# Patient Record
Sex: Male | Born: 1990 | Race: White | Hispanic: No | Marital: Single
Health system: Southern US, Community
[De-identification: ages and names within clinical notes are randomized; demographics above are authoritative.]

## PROBLEM LIST (undated history)

## (undated) HISTORY — PX: BRAIN SURGERY: SHX531

---
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
## 2015-02-26 ENCOUNTER — Inpatient Hospital Stay (HOSPITAL_COMMUNITY): Payer: Worker's Compensation

## 2015-02-26 ENCOUNTER — Emergency Department (HOSPITAL_COMMUNITY): Payer: Worker's Compensation

## 2015-02-26 ENCOUNTER — Inpatient Hospital Stay (HOSPITAL_COMMUNITY)
Admission: EM | Admit: 2015-02-26 | Discharge: 2015-03-01 | DRG: 964 | Disposition: A | Discharge status: Home / Self Care | Payer: Worker's Compensation | Attending: General Surgery | Admitting: General Surgery

## 2015-02-26 ENCOUNTER — Encounter (HOSPITAL_COMMUNITY): Payer: Self-pay | Admitting: *Deleted

## 2015-02-26 DIAGNOSIS — Z91013 Allergy to seafood: Secondary | ICD-10-CM | POA: Diagnosis not present

## 2015-02-26 DIAGNOSIS — S2243XA Multiple fractures of ribs, bilateral, initial encounter for closed fracture: Secondary | ICD-10-CM | POA: Diagnosis present

## 2015-02-26 DIAGNOSIS — Z91030 Bee allergy status: Secondary | ICD-10-CM | POA: Diagnosis not present

## 2015-02-26 DIAGNOSIS — W3189XA Contact with other specified machinery, initial encounter: Secondary | ICD-10-CM

## 2015-02-26 DIAGNOSIS — S37002A Unspecified injury of left kidney, initial encounter: Secondary | ICD-10-CM | POA: Diagnosis present

## 2015-02-26 DIAGNOSIS — J939 Pneumothorax, unspecified: Secondary | ICD-10-CM

## 2015-02-26 DIAGNOSIS — Z91012 Allergy to eggs: Secondary | ICD-10-CM | POA: Diagnosis not present

## 2015-02-26 DIAGNOSIS — S37012A Minor contusion of left kidney, initial encounter: Secondary | ICD-10-CM | POA: Diagnosis present

## 2015-02-26 DIAGNOSIS — S299XXA Unspecified injury of thorax, initial encounter: Secondary | ICD-10-CM

## 2015-02-26 DIAGNOSIS — T148XXA Other injury of unspecified body region, initial encounter: Secondary | ICD-10-CM

## 2015-02-26 DIAGNOSIS — Y990 Civilian activity done for income or pay: Secondary | ICD-10-CM | POA: Diagnosis not present

## 2015-02-26 DIAGNOSIS — S270XXA Traumatic pneumothorax, initial encounter: Secondary | ICD-10-CM

## 2015-02-26 DIAGNOSIS — R339 Retention of urine, unspecified: Secondary | ICD-10-CM | POA: Diagnosis not present

## 2015-02-26 DIAGNOSIS — J342 Deviated nasal septum: Secondary | ICD-10-CM | POA: Diagnosis present

## 2015-02-26 DIAGNOSIS — Y9269 Other specified industrial and construction area as the place of occurrence of the external cause: Secondary | ICD-10-CM

## 2015-02-26 DIAGNOSIS — S298XXA Other specified injuries of thorax, initial encounter: Secondary | ICD-10-CM | POA: Diagnosis present

## 2015-02-26 DIAGNOSIS — Z9689 Presence of other specified functional implants: Secondary | ICD-10-CM

## 2015-02-26 DIAGNOSIS — S272XXA Traumatic hemopneumothorax, initial encounter: Principal | ICD-10-CM | POA: Diagnosis present

## 2015-02-26 DIAGNOSIS — T148 Other injury of unspecified body region: Secondary | ICD-10-CM | POA: Diagnosis present

## 2015-02-26 DIAGNOSIS — R338 Other retention of urine: Secondary | ICD-10-CM | POA: Diagnosis not present

## 2015-02-26 DIAGNOSIS — S2249XA Multiple fractures of ribs, unspecified side, initial encounter for closed fracture: Secondary | ICD-10-CM

## 2015-02-26 LAB — COMPREHENSIVE METABOLIC PANEL
ALT: 93 U/L — AB (ref 17–63)
ANION GAP: 11 (ref 5–15)
AST: 86 U/L — ABNORMAL HIGH (ref 15–41)
Albumin: 4.7 g/dL (ref 3.5–5.0)
Alkaline Phosphatase: 39 U/L (ref 38–126)
BUN: 13 mg/dL (ref 6–20)
CHLORIDE: 101 mmol/L (ref 101–111)
CO2: 22 mmol/L (ref 22–32)
CREATININE: 1.3 mg/dL — AB (ref 0.61–1.24)
Calcium: 9.2 mg/dL (ref 8.9–10.3)
Glucose, Bld: 131 mg/dL — ABNORMAL HIGH (ref 65–99)
POTASSIUM: 3.6 mmol/L (ref 3.5–5.1)
Sodium: 134 mmol/L — ABNORMAL LOW (ref 135–145)
Total Bilirubin: 1.4 mg/dL — ABNORMAL HIGH (ref 0.3–1.2)
Total Protein: 7.4 g/dL (ref 6.5–8.1)

## 2015-02-26 LAB — SAMPLE TO BLOOD BANK

## 2015-02-26 LAB — PROTIME-INR
INR: 1.14 (ref 0.00–1.49)
Prothrombin Time: 14.8 seconds (ref 11.6–15.2)

## 2015-02-26 LAB — CBC
HCT: 43.7 % (ref 39.0–52.0)
Hemoglobin: 14.9 g/dL (ref 13.0–17.0)
MCH: 30.7 pg (ref 26.0–34.0)
MCHC: 34.1 g/dL (ref 30.0–36.0)
MCV: 90.1 fL (ref 78.0–100.0)
PLATELETS: 202 10*3/uL (ref 150–400)
RBC: 4.85 MIL/uL (ref 4.22–5.81)
RDW: 12.9 % (ref 11.5–15.5)
WBC: 18.4 10*3/uL — AB (ref 4.0–10.5)

## 2015-02-26 LAB — CDS SEROLOGY

## 2015-02-26 LAB — ETHANOL: Alcohol, Ethyl (B): <5 mg/dL (ref <5)

## 2015-02-26 LAB — CK: Total CK: 568 U/L — ABNORMAL HIGH (ref 49–397)

## 2015-02-26 MED ORDER — OXYCODONE HCL 5 MG PO TABS
10.0000 mg | ORAL_TABLET | ORAL | Status: DC | PRN
Start: 2015-02-26 — End: 2015-03-01

## 2015-02-26 MED ORDER — IOHEXOL 300 MG/ML  SOLN
100.0000 mL | Freq: Once | INTRAMUSCULAR | Status: AC | PRN
  Administered 2015-02-26: 100 mL via INTRAVENOUS

## 2015-02-26 MED ORDER — ONDANSETRON HCL 4 MG/2ML IJ SOLN
4.0000 mg | Freq: Once | INTRAMUSCULAR | Status: AC
  Administered 2015-03-01: 4 mg via INTRAVENOUS
  Filled 2015-02-26: qty 2

## 2015-02-26 MED ORDER — KCL IN DEXTROSE-NACL 20-5-0.45 MEQ/L-%-% IV SOLN
INTRAVENOUS | Status: DC
  Administered 2015-02-27 – 2015-02-28 (×3): via INTRAVENOUS
  Filled 2015-02-26 (×4): qty 1000

## 2015-02-26 MED ORDER — SODIUM CHLORIDE 0.9 % IV BOLUS (SEPSIS)
1000.0000 mL | Freq: Once | INTRAVENOUS | Status: AC
  Administered 2015-02-26: 1000 mL via INTRAVENOUS

## 2015-02-26 MED ORDER — FENTANYL CITRATE (PF) 100 MCG/2ML IJ SOLN
100.0000 ug | Freq: Once | INTRAMUSCULAR | Status: AC
  Administered 2015-02-26: 100 ug via INTRAVENOUS
  Filled 2015-02-26: qty 2

## 2015-02-26 MED ORDER — LIDOCAINE-EPINEPHRINE 2 %-1:200000 IJ SOLN
20.0000 mL | Freq: Once | INTRAMUSCULAR | Status: AC
Start: 2015-02-26 — End: 2015-02-26
  Administered 2015-02-26: 20 mL via INTRADERMAL

## 2015-02-26 MED ORDER — FENTANYL CITRATE (PF) 100 MCG/2ML IJ SOLN: 50.0000 ug | INTRAMUSCULAR | Status: DC | PRN

## 2015-02-26 MED ORDER — ONDANSETRON HCL 4 MG/2ML IJ SOLN
4.0000 mg | Freq: Once | INTRAMUSCULAR | Status: AC
  Administered 2015-02-26: 4 mg via INTRAVENOUS
  Filled 2015-02-26: qty 2

## 2015-02-26 MED ORDER — PANTOPRAZOLE SODIUM 40 MG IV SOLR
40.0000 mg | Freq: Every day | INTRAVENOUS | Status: DC
  Administered 2015-02-27: 40 mg via INTRAVENOUS
  Filled 2015-02-26 (×3): qty 40

## 2015-02-26 MED ORDER — HYDROMORPHONE HCL 1 MG/ML IJ SOLN: 1.0000 mg | Freq: Once | INTRAMUSCULAR | Status: DC

## 2015-02-26 MED ORDER — HYDROMORPHONE HCL 1 MG/ML IJ SOLN
1.0000 mg | Freq: Once | INTRAMUSCULAR | Status: AC
  Administered 2015-02-26: 1 mg via INTRAVENOUS
  Filled 2015-02-26: qty 1

## 2015-02-26 MED ORDER — PROMETHAZINE HCL 25 MG/ML IJ SOLN
25.0000 mg | Freq: Four times a day (QID) | INTRAMUSCULAR | Status: DC | PRN
  Administered 2015-02-26: 25 mg via INTRAVENOUS
  Filled 2015-02-26: qty 1

## 2015-02-26 MED ORDER — SODIUM CHLORIDE 0.9 % IV SOLN
25.0000 mg | Freq: Three times a day (TID) | INTRAVENOUS | Status: DC | PRN
  Filled 2015-02-26: qty 1

## 2015-02-26 MED ORDER — LIDOCAINE-EPINEPHRINE 1 %-1:100000 IJ SOLN
30.0000 mL | Freq: Once | INTRAMUSCULAR | Status: AC
  Administered 2015-02-26: 30 mL via INTRADERMAL
  Filled 2015-02-26: qty 1

## 2015-02-26 MED ORDER — FENTANYL CITRATE (PF) 100 MCG/2ML IJ SOLN
100.0000 ug | Freq: Once | INTRAMUSCULAR | Status: AC
  Administered 2015-02-26: 100 ug via INTRAVENOUS

## 2015-02-26 MED ORDER — ENOXAPARIN SODIUM 40 MG/0.4ML ~~LOC~~ SOLN
40.0000 mg | SUBCUTANEOUS | Status: DC
  Administered 2015-02-27 – 2015-03-01 (×3): 40 mg via SUBCUTANEOUS
  Filled 2015-02-26 (×3): qty 0.4

## 2015-02-26 MED ORDER — LIDOCAINE HCL (PF) 1 % IJ SOLN: 30.0000 mL | Freq: Once | INTRAMUSCULAR | Status: DC

## 2015-02-26 MED ORDER — PROMETHAZINE HCL 25 MG/ML IJ SOLN
25.0000 mg | Freq: Four times a day (QID) | INTRAMUSCULAR | Status: DC | PRN
  Administered 2015-02-27: 25 mg via INTRAVENOUS
  Filled 2015-02-26: qty 1

## 2015-02-26 MED ORDER — OXYCODONE HCL 5 MG PO TABS: 5.0000 mg | ORAL_TABLET | ORAL | Status: DC | PRN

## 2015-02-26 MED ORDER — PANTOPRAZOLE SODIUM 40 MG PO TBEC
40.0000 mg | DELAYED_RELEASE_TABLET | Freq: Every day | ORAL | Status: DC
  Administered 2015-02-28: 40 mg via ORAL
  Filled 2015-02-26: qty 1

## 2015-02-26 MED ORDER — PROMETHAZINE HCL 25 MG/ML IJ SOLN
25.0000 mg | Freq: Once | INTRAMUSCULAR | Status: AC
  Administered 2015-02-26: 25 mg via INTRAVENOUS

## 2015-02-26 MED ORDER — ACETAMINOPHEN 325 MG PO TABS
650.0000 mg | ORAL_TABLET | ORAL | Status: DC | PRN
  Administered 2015-02-28 (×2): 650 mg via ORAL
  Filled 2015-02-26: qty 2

## 2015-02-26 NOTE — ED Notes (Signed)
Oxygen 2l via  applied due to insertion of chest tube

## 2015-02-26 NOTE — ED Notes (Signed)
Patient vomiting, order recd for Zofran.

## 2015-02-26 NOTE — ED Notes (Signed)
Family at bedside. 

## 2015-02-26 NOTE — ED Notes (Signed)
Pt transported to CT with RN and EMT
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
## 2015-02-26 NOTE — ED Notes (Signed)
Patient vomiting -  aware
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
## 2015-02-26 NOTE — H&P (Signed)
Erik Kirk is an 24 y.o. male.   Chief Complaint: B back and rib pain HPI: Erik Kirk was working around a forklift when he was pinned up against a metal structure. No LOC. He is unsure how long he was pinned. C/O B rib pain and SOB. No LOC. He came in as a level 2 trauma. Work up demonstrated B rib FXs with R 20% PTX and L trace PTX. He also has a grade 1 L renal contusion. Trauma was called to admit.  History reviewed. No pertinent past medical history.  Past Surgical History  Procedure Laterality Date  . Brain surgery      No family history on file. Social History:  has no tobacco, alcohol, and drug history on file.  Allergies:  Allergies  Allergen Reactions  . Bee Venom Anaphylaxis  . Eggs Or Egg-Derived Products Anaphylaxis and Hives  . Shellfish Allergy Anaphylaxis and Hives     (Not in a hospital admission)  Results for orders placed or performed during the hospital encounter of 02/26/15 (from the past 48 hour(s))  Sample to Blood Bank     Status: None   Collection Time: 02/26/15  6:17 PM  Result Value Ref Range   Blood Bank Specimen SAMPLE AVAILABLE FOR TESTING    Sample Expiration 02/27/2015   Comprehensive metabolic panel     Status: Abnormal   Collection Time: 02/26/15  6:20 PM  Result Value Ref Range   Sodium 134 (L) 135 - 145 mmol/L   Potassium 3.6 3.5 - 5.1 mmol/L   Chloride 101 101 - 111 mmol/L   CO2 22 22 - 32 mmol/L   Glucose, Bld 131 (H) 65 - 99 mg/dL   BUN 13 6 - 20 mg/dL   Creatinine, Ser 1.30 (H) 0.61 - 1.24 mg/dL   Calcium 9.2 8.9 - 10.3 mg/dL   Total Protein 7.4 6.5 - 8.1 g/dL   Albumin 4.7 3.5 - 5.0 g/dL   AST 86 (H) 15 - 41 U/L   ALT 93 (H) 17 - 63 U/L   Alkaline Phosphatase 39 38 - 126 U/L   Total Bilirubin 1.4 (H) 0.3 - 1.2 mg/dL   GFR calc non Af Amer >60 >60 mL/min   GFR calc Af Amer >60 >60 mL/min    Comment: (NOTE) The eGFR has been calculated using the CKD EPI equation. This calculation has not been validated in all clinical  situations. eGFR's persistently <60 mL/min signify possible Chronic Kidney Disease.    Anion gap 11 5 - 15  CBC     Status: Abnormal   Collection Time: 02/26/15  6:20 PM  Result Value Ref Range   WBC 18.4 (H) 4.0 - 10.5 K/uL   RBC 4.85 4.22 - 5.81 MIL/uL   Hemoglobin 14.9 13.0 - 17.0 g/dL   HCT 43.7 39.0 - 52.0 %   MCV 90.1 78.0 - 100.0 fL   MCH 30.7 26.0 - 34.0 pg   MCHC 34.1 30.0 - 36.0 g/dL   RDW 12.9 11.5 - 15.5 %   Platelets 202 150 - 400 K/uL  Ethanol     Status: None   Collection Time: 02/26/15  6:20 PM  Result Value Ref Range   Alcohol, Ethyl (B) <5 <5 mg/dL    Comment:        LOWEST DETECTABLE LIMIT FOR SERUM ALCOHOL IS 5 mg/dL FOR MEDICAL PURPOSES ONLY   Protime-INR     Status: None   Collection Time: 02/26/15  6:20 PM  Result Value Ref  Range   Prothrombin Time 14.8 11.6 - 15.2 seconds   INR 1.14 0.00 - 1.49  CK     Status: Abnormal   Collection Time: 02/26/15  6:20 PM  Result Value Ref Range   Total CK 568 (H) 49 - 397 U/L   Ct Head Wo Contrast  02/26/2015  CLINICAL DATA:  24 year old involved in a forklift accident while work earlier today. The patient's body was pinned between a forklift and a metal rack. Patient unable to lift his legs. Initial encounter. EXAM: CT HEAD WITHOUT CONTRAST CT CERVICAL SPINE WITHOUT CONTRAST TECHNIQUE: Multidetector CT imaging of the head and cervical spine was performed following the standard protocol without intravenous contrast. Multiplanar CT image reconstructions of the cervical spine were also generated. COMPARISON:  None. FINDINGS: CT HEAD FINDINGS Ventricular system normal in size and appearance for age. No mass lesion. No midline shift. No acute hemorrhage or hematoma. No extra-axial fluid collections. No evidence of acute infarction. No focal brain parenchymal abnormalities. No skull fractures or other focal osseous abnormalities involving the skull. Small mucous retention cysts or polyps in the maxillary sinuses. Remaining  paranasal sinuses, bilateral mastoid air cells, and bilateral middle ear cavities well-aerated. Note made of severe bony nasal septal deviation to the right. CT CERVICAL SPINE FINDINGS No fractures identified involving the cervical spine. Sagittal reconstructed images demonstrate anatomic posterior alignment. No evidence of spinal stenosis. Neural foramina widely patent throughout. Facet joints intact throughout. Disc spaces well preserved, and no significant disc protrusion identified on the soft tissue windows. Coronal reformatted images demonstrate an intact craniocervical junction, intact dens and intact lateral masses throughout. Note is made of a right apical pneumothorax. The patient had a CT chest, abdomen and pelvis which will be reported separately. IMPRESSION: 1. Normal intracranially. 2. No cervical spine fractures identified. 3. Right apical pneumothorax. Please see results of the CT chest, abdomen and pelvis reported separately. Electronically Signed   By: Evangeline Dakin M.D.   On: 02/26/2015 19:26   Ct Chest W Contrast  02/26/2015  CLINICAL DATA:  Diffuse back pain after being crushed between a forklift and metal back. The patient is unable to lift his legs. Right leg pain. EXAM: CT CHEST, ABDOMEN, AND PELVIS WITH CONTRAST TECHNIQUE: Multidetector CT imaging of the chest, abdomen and pelvis was performed following the standard protocol during bolus administration of intravenous contrast. CONTRAST:  173m OMNIPAQUE IOHEXOL 300 MG/ML  SOLN COMPARISON:  None. FINDINGS: CT CHEST FINDINGS Mediastinum/Lymph Nodes: No mediastinal hemorrhage or enlarged lymph nodes. Lungs/Pleura: Approximately 20% right pneumothorax and minimal left pneumothorax. There is also mild patchy airspace opacity in the inferior aspect of the right lower lobe and a small amount of right pleural blood. No lung nodules. Musculoskeletal: Displaced, mildly comminuted left posterior eleventh rib fracture. This is at the level of the  upper pole of the left kidney. Comminuted, displaced right posterior tenth rib fracture. This includes a fragment displaced anteriorly and protruding into the posterior aspect of the right lower lobe. There is also a mildly displaced right posterior eleventh rib fracture. Mildly displaced right posterior ninth rib fracture. No spinal fractures or subluxations are seen. CT ABDOMEN PELVIS FINDINGS Hepatobiliary: Small cyst in the central right lobe of the liver. The remainder of the liver appears normal. Pancreas: Normal. Spleen: Normal. Adrenals/Urinary Tract: Normal appearing adrenal glands. Mild, ill-defined low density in the upper pole of the right kidney with some skull loss of the corticomedullary differentiation. The renal arteries and veins appear intact bilaterally. Small  right renal cyst. No ureteral or bladder abnormalities are seen. Normal-sized prostate gland. Stomach/Bowel: No abnormalities.  Normal appearing appendix. Vascular/Lymphatic: Unremarkable. Intact renal arteries and veins bilaterally. Reproductive: Unremarkable. Other: No free peritoneal fluid or air. 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 No other fractures and no subluxations. IMPRESSION: 1. Approximately 20% right pneumothorax and minimal left pneumothorax. 2. Contusion in the inferior, posterior aspect of the right lower lobe. 3. Small amount of right pleural blood. 4. Right posterior ninth, tenth and eleventh rib fractures and left posterior eleventh rib fracture. The right posterior tenth rib fracture has an anteriorly displaced linear fragment with its tip within the posterior aspect of the right lower lobe. 5. Contusion of the upper pole of the left kidney. 6. 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 Critical Value/emergent results were called by telephone at the time of interpretation on 02/26/2015 at 7:31 pm  to Dr. Evelina Bucy , who verbally acknowledged these results. Electronically Signed   By: Claudie Revering M.D.   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 The patient's body was pinned between a forklift and a metal rack. Patient unable to lift his legs. Initial encounter. EXAM: CT HEAD WITHOUT CONTRAST CT CERVICAL SPINE WITHOUT CONTRAST TECHNIQUE: Multidetector CT imaging of the head and cervical spine was performed following the standard protocol without intravenous contrast. Multiplanar CT image reconstructions of the cervical spine were also generated. COMPARISON:  None. FINDINGS: CT HEAD FINDINGS Ventricular system normal in size and appearance for age. No mass lesion. No midline shift. No acute hemorrhage or hematoma. No extra-axial fluid collections. No evidence of acute infarction. No focal brain parenchymal abnormalities. No skull fractures or other focal osseous abnormalities involving the skull. Small mucous retention cysts or polyps in the maxillary sinuses. Remaining paranasal sinuses, bilateral mastoid air cells, and bilateral middle ear cavities well-aerated. Note made of severe bony nasal septal deviation to the right. CT CERVICAL SPINE FINDINGS No fractures identified involving the cervical spine. Sagittal reconstructed images demonstrate anatomic posterior alignment. No evidence of spinal stenosis. Neural foramina widely patent throughout. Facet joints intact throughout. Disc spaces well preserved, and no significant disc protrusion identified on the soft tissue windows. Coronal reformatted images demonstrate an intact craniocervical junction, intact dens and intact lateral masses throughout. Note is made of a right apical pneumothorax. The patient had a CT chest, abdomen and pelvis which will be reported separately. IMPRESSION: 1. Normal intracranially. 2. No cervical spine fractures  identified. 3. Right apical pneumothorax. Please see results of the CT chest, abdomen and pelvis reported separately. Electronically Signed   By: Evangeline Dakin M.D.   On: 02/26/2015 19:26   Ct Thoracic Spine Wo Contrast  02/26/2015  CLINICAL DATA:  Diffuse back pain following a crush injury between a forklift and metal rack. EXAM: CT THORACIC SPINE WITHOUT CONTRAST TECHNIQUE: Multidetector CT imaging of the thoracic spine was performed without intravenous contrast administration. Multiplanar CT image reconstructions were also generated. COMPARISON:  Chest CT obtained at the same time. FINDINGS: Previously described bilateral pneumothoraces and bilateral rib fractures. Previously described right lower lobe pulmonary contusion and small amount of right pleural blood. No thoracic spine fracture or subluxation is seen. Small Schmorl's nodes at multiple levels. IMPRESSION: 1. No thoracic spine fracture or subluxation. 2. Previously described bilateral rib fractures, right lower lobe  pulmonary contusion, right pleural blood and bilateral pneumothoraces. Electronically Signed   By: Claudie Revering M.D.   On: 02/26/2015 19:49   Ct Lumbar Spine Wo Contrast  02/26/2015  CLINICAL DATA:  Initial encounter for fork lift accident earlier today. Pain down entire spine. EXAM: CT LUMBAR SPINE WITHOUT CONTRAST TECHNIQUE: Multidetector CT imaging of the lumbar spine was performed without intravenous contrast administration. Multiplanar CT image reconstructions were also generated. COMPARISON:  None. FINDINGS: No evidence for lumbar spine fracture. Intervertebral disc spaces are preserved. The facets are well aligned bilaterally. Bilateral L5 pars interarticularis defects are evident with trace anterolisthesis of L5 on S1. No evidence for paraspinal hematoma. Fracture the posterior right eleventh and twelfth ribs noted. Although incompletely characterize, there appears to be decreased perfusion to the upper pole of the left  kidney. IMPRESSION: 1. No evidence for lumbar spine fracture. 2. 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 3. Acute fractures of the posterior right eleventh and twelfth ribs. Of note, the entire ribs have not been fully visualized on either side on this dedicated lumbar spine CT. 4. Apparent decreased or altered perfusion to the upper pole the left kidney, incompletely characterized. Electronically Signed   By: Misty Stanley M.D.   On: 02/26/2015 19:58   Ct Abdomen Pelvis W Contrast  02/26/2015  CLINICAL DATA:  Diffuse back pain after being crushed between a forklift and metal back. The patient is unable to lift his legs. Right leg pain. EXAM: CT CHEST, ABDOMEN, AND PELVIS WITH CONTRAST TECHNIQUE: Multidetector CT imaging of the chest, abdomen and pelvis was performed following the standard protocol during bolus administration of intravenous contrast. CONTRAST:  158m OMNIPAQUE IOHEXOL 300 MG/ML  SOLN COMPARISON:  None. FINDINGS: CT CHEST FINDINGS Mediastinum/Lymph Nodes: No mediastinal hemorrhage or enlarged lymph nodes. Lungs/Pleura: Approximately 20% right pneumothorax and minimal left pneumothorax. There is also mild patchy airspace opacity in the inferior aspect of the right lower lobe and a small amount of right pleural blood. No lung nodules. Musculoskeletal: Displaced, mildly comminuted left posterior eleventh rib fracture. This is at the level of the upper pole of the left kidney. Comminuted, displaced right posterior tenth rib fracture. This includes a fragment displaced anteriorly and protruding into the posterior aspect of the right lower lobe. There is also a mildly displaced right posterior eleventh rib fracture. Mildly displaced right posterior ninth rib fracture. No spinal fractures or subluxations are seen. CT ABDOMEN PELVIS FINDINGS Hepatobiliary: Small cyst in the central right lobe of the liver. The remainder of the liver appears normal. Pancreas: Normal. Spleen:  Normal. Adrenals/Urinary Tract: Normal appearing adrenal glands. Mild, ill-defined low density in the upper pole of the right kidney with some skull loss of the corticomedullary differentiation. The renal arteries and veins appear intact bilaterally. Small right renal cyst. No ureteral or bladder abnormalities are seen. Normal-sized prostate gland. Stomach/Bowel: No abnormalities.  Normal appearing appendix. Vascular/Lymphatic: Unremarkable. Intact renal arteries and veins bilaterally. Reproductive: Unremarkable. Other: No free peritoneal fluid or air. 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 No other fractures and no subluxations. IMPRESSION: 1. Approximately 20% right pneumothorax and minimal left pneumothorax. 2. Contusion in the inferior, posterior aspect of the right lower lobe. 3. Small amount of right pleural blood. 4. Right posterior ninth, tenth and eleventh rib fractures and left posterior eleventh rib fracture. The right posterior tenth rib fracture has an anteriorly displaced linear fragment with its tip within the posterior aspect  of the right lower lobe. 5. Contusion of the upper pole of the left kidney. 6. 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 Critical Value/emergent results were called by telephone at the time of interpretation on 02/26/2015 at 7:31 pm to Dr. Evelina Bucy , who verbally acknowledged these results. Electronically Signed   By: Claudie Revering M.D.   On: 02/26/2015 19:45   Dg Pelvis Portable  02/26/2015  CLINICAL DATA:  Right thigh pain after being pinned between a forklift and metal rack today. EXAM: PORTABLE PELVIS 1-2 VIEWS COMPARISON:  None. FINDINGS: The right iliac crest is not included in its entirety. The remainder of the pelvis has a normal appearance without fracture or dislocation. IMPRESSION: No fracture or dislocation. Electronically Signed   By:  Claudie Revering M.D.   On: 02/26/2015 18:36   Dg Chest Portable 1 View  02/26/2015  CLINICAL DATA:  Trauma from fork lift.  Initial encounter. EXAM: PORTABLE CHEST 1 VIEW COMPARISON:  None. FINDINGS: The heart size and mediastinal contours are within normal limits. There is no evidence of pulmonary edema, consolidation, pneumothorax, nodule or pleural fluid. The visualized skeletal structures are unremarkable. IMPRESSION: No acute findings. Electronically Signed   By: Aletta Edouard M.D.   On: 02/26/2015 18:34   Dg Femur Port, 1v Right  02/26/2015  CLINICAL DATA:  Right thigh pain after being pinned between a forklift and metal rack today. EXAM: RIGHT FEMUR PORTABLE 1 VIEW COMPARISON:  None. FINDINGS: 2 portable AP views of the right femur demonstrate normal appearing bones and soft tissues. No fracture or dislocation seen on these views. IMPRESSION: Limited examination in the AP projection only with no visible fracture or dislocation. If there remains a clinical concern for fracture, a routine two-view examination would be recommended. Electronically Signed   By: Claudie Revering M.D.   On: 02/26/2015 18:35    Review of Systems  Constitutional: Negative.   HENT: Negative.   Eyes: Negative.   Respiratory: Positive for shortness of breath.        Pain with deep breaths  Cardiovascular: Positive for chest pain.  Gastrointestinal: Negative.   Genitourinary: Negative.   Musculoskeletal: Positive for back pain.  Skin: Negative.   Neurological: Negative.   Endo/Heme/Allergies: Negative.   Psychiatric/Behavioral: Negative.     Blood pressure 126/83, pulse 82, temperature 98.2 F (36.8 C), temperature source Oral, resp. rate 13, height _0  (1.803 m), weight 79.833 kg (176 lb), SpO2 94 %. Physical Exam  Constitutional: He appears well-developed and well-nourished. He appears distressed.  HENT:  Head: Normocephalic. Head is without abrasion and without contusion.  Right Ear: Hearing, tympanic  membrane, external ear and ear canal normal.  Left Ear: Hearing, tympanic membrane, external ear and ear canal normal.  Nose: No sinus tenderness or nasal deformity.  Mouth/Throat: Uvula is midline, oropharynx is clear and moist and mucous membranes are normal.  Eyes: EOM are normal. Pupils are equal, round, and reactive to light. Right eye exhibits no discharge. Left eye exhibits no discharge.  Neck: No tracheal deviation present.  No posterior midline tenderness, no pain on AROM  Cardiovascular: Regular rhythm, normal heart sounds and intact distal pulses.   Tachy 120  Respiratory: Breath sounds normal. No stridor. He has no wheezes. He has no rales. He exhibits tenderness.  B posterior rib tenderness  GI: Soft. He exhibits no distension. There is no tenderness. There is no rebound and no guarding.  Genitourinary: Penis normal.  Musculoskeletal:  Normal range of motion. He exhibits no edema or tenderness.  Neurological: He is alert. He displays no atrophy and no tremor. He exhibits normal muscle tone. He displays no seizure activity. GCS eye subscore is 4. GCS verbal subscore is 5. GCS motor subscore is 6.  Skin: Skin is warm.  Psychiatric:  anxious     Assessment/Plan Pinned by forklift R rib FXs 9-11 with 20% PTX L 11th rib FX with trace PTX L grade 1 upper pole renal contusion  Place R chest tube in ED. Admit to trauma, SDU. F/U labs, pulmonary toilet, & CXR. I spoke with his family.  Sael Furches E 02/26/2015, 9:37 PM
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
## 2015-02-27 ENCOUNTER — Inpatient Hospital Stay (HOSPITAL_COMMUNITY): Payer: Worker's Compensation

## 2015-02-27 LAB — BASIC METABOLIC PANEL
Anion gap: 6 (ref 5–15)
BUN: 12 mg/dL (ref 6–20)
CALCIUM: 8.4 mg/dL — AB (ref 8.9–10.3)
CHLORIDE: 102 mmol/L (ref 101–111)
CO2: 26 mmol/L (ref 22–32)
CREATININE: 1.29 mg/dL — AB (ref 0.61–1.24)
GFR calc non Af Amer: >60 mL/min (ref >60)
GLUCOSE: 152 mg/dL — AB (ref 65–99)
Potassium: 4.2 mmol/L (ref 3.5–5.1)
Sodium: 134 mmol/L — ABNORMAL LOW (ref 135–145)

## 2015-02-27 LAB — CBC
HEMATOCRIT: 37.4 % — AB (ref 39.0–52.0)
HEMOGLOBIN: 12.5 g/dL — AB (ref 13.0–17.0)
MCH: 30.6 pg (ref 26.0–34.0)
MCHC: 33.4 g/dL (ref 30.0–36.0)
MCV: 91.4 fL (ref 78.0–100.0)
Platelets: 171 10*3/uL (ref 150–400)
RBC: 4.09 MIL/uL — AB (ref 4.22–5.81)
RDW: 13.1 % (ref 11.5–15.5)
WBC: 10.7 10*3/uL — AB (ref 4.0–10.5)

## 2015-02-27 LAB — MRSA PCR SCREENING: MRSA by PCR: NEGATIVE

## 2015-02-27 MED ORDER — KETOROLAC TROMETHAMINE 15 MG/ML IJ SOLN
15.0000 mg | Freq: Four times a day (QID) | INTRAMUSCULAR | Status: DC
  Administered 2015-02-27 – 2015-03-01 (×7): 15 mg via INTRAVENOUS
  Filled 2015-02-27 (×10): qty 1

## 2015-02-27 MED ORDER — PROCHLORPERAZINE EDISYLATE 5 MG/ML IJ SOLN
10.0000 mg | Freq: Four times a day (QID) | INTRAMUSCULAR | Status: DC | PRN
  Filled 2015-02-27 (×2): qty 2

## 2015-02-27 MED ORDER — KETOROLAC TROMETHAMINE 30 MG/ML IJ SOLN
30.0000 mg | Freq: Once | INTRAMUSCULAR | Status: AC
  Administered 2015-02-27: 30 mg via INTRAVENOUS
  Filled 2015-02-27: qty 1

## 2015-02-27 MED ORDER — BETHANECHOL CHLORIDE 10 MG PO TABS
10.0000 mg | ORAL_TABLET | Freq: Three times a day (TID) | ORAL | Status: DC
  Administered 2015-02-27 – 2015-02-28 (×6): 10 mg via ORAL
  Filled 2015-02-27 (×9): qty 1

## 2015-02-27 MED ORDER — PROCHLORPERAZINE EDISYLATE 5 MG/ML IJ SOLN
10.0000 mg | Freq: Once | INTRAMUSCULAR | Status: AC
  Administered 2015-02-27: 10 mg via INTRAVENOUS
  Filled 2015-02-27: qty 2

## 2015-02-27 MED ORDER — TAMSULOSIN HCL 0.4 MG PO CAPS
0.4000 mg | ORAL_CAPSULE | Freq: Every day | ORAL | Status: DC
  Administered 2015-02-27 – 2015-02-28 (×2): 0.4 mg via ORAL
  Filled 2015-02-27 (×2): qty 1

## 2015-02-27 MED ORDER — MORPHINE SULFATE (PF) 2 MG/ML IV SOLN
2.0000 mg | INTRAVENOUS | Status: DC | PRN
  Administered 2015-03-01: 4 mg via INTRAVENOUS
  Filled 2015-02-27: qty 2

## 2015-02-27 MED ORDER — LORAZEPAM 2 MG/ML IJ SOLN
0.5000 mg | INTRAMUSCULAR | Status: DC | PRN
  Administered 2015-02-27 (×2): 1 mg via INTRAVENOUS
  Filled 2015-02-27 (×2): qty 1

## 2015-02-27 MED ORDER — METOCLOPRAMIDE HCL 5 MG/ML IJ SOLN
5.0000 mg | Freq: Three times a day (TID) | INTRAMUSCULAR | Status: DC | PRN
  Filled 2015-02-27: qty 1
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
## 2015-02-27 NOTE — Progress Notes (Signed)
Trauma Service Note  Subjective: Patient sitting up in chair, having gotten there with the assistance of physical therapy  Objective: Vital signs in last 24 hours: Temp:  [97.7 F (36.5 C)-98.6 F (37 C)] 98.6 F (37 C) (11/01 0835) Pulse Rate:  [65-112] 68 (11/01 0800) Resp:  [0-33] 17 (11/01 0800) BP: (109-144)/(57-90) 142/78 mmHg (11/01 0800) SpO2:  [92 %-100 %] 100 % (11/01 0800) Weight:  [79.8 kg (175 lb 14.8 oz)-79.833 kg (176 lb)] 79.8 kg (175 lb 14.8 oz) (10/31 2340)    Intake/Output from previous day: 10/31 0701 - 11/01 0700 In: 2598.8 [P.O.:120; I.V.:2478.8] Out: 1235 [Urine:1210; Chest Tube:25] Intake/Output this shift: Total I/O In: 60 [P.O.:60] Out: 0   General: No acute distress, although he is breathing very shallow and has not used his IS.  Lungs: Clear, but shallow.  CXR shows not pneumothorax and there is no air leakage.  Abd: Soft, benign  Extremities: No changes  Neuro: Intact  Lab Results: CBC   Recent Labs  02/26/15 1820 02/27/15 0333  WBC 18.4* 10.7*  HGB 14.9 12.5*  HCT 43.7 37.4*  PLT 202 171   BMET  Recent Labs  02/26/15 1820 02/27/15 0333  NA 134* 134*  K 3.6 4.2  CL 101 102  CO2 22 26  GLUCOSE 131* 152*  BUN 13 12  CREATININE 1.30* 1.29*  CALCIUM 9.2 8.4*   PT/INR  Recent Labs  02/26/15 1820  LABPROT 14.8  INR 1.14   ABG No results for input(s): PHART, HCO3 in the last 72 hours.  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 The patient's body was pinned between a forklift and a metal rack. Patient unable to lift his legs. Initial encounter. EXAM: CT HEAD WITHOUT CONTRAST CT CERVICAL SPINE WITHOUT CONTRAST TECHNIQUE: Multidetector CT imaging of the head and cervical spine was performed following the standard protocol without intravenous contrast. Multiplanar CT image reconstructions of the  cervical spine were also generated. COMPARISON:  None. FINDINGS: CT HEAD FINDINGS Ventricular system normal in size and appearance for age. No mass lesion. No midline shift. No acute hemorrhage or hematoma. No extra-axial fluid collections. No evidence of acute infarction. No focal brain parenchymal abnormalities. No skull fractures or other focal osseous abnormalities involving the skull. Small mucous retention cysts or polyps in the maxillary sinuses. Remaining paranasal sinuses, bilateral mastoid air cells, and bilateral middle ear cavities well-aerated. Note made of severe bony nasal septal deviation to the right. CT CERVICAL SPINE FINDINGS No fractures identified involving the cervical spine. Sagittal reconstructed images demonstrate anatomic posterior alignment. No evidence of spinal stenosis. Neural foramina widely patent throughout. Facet joints intact throughout. Disc spaces well preserved, and no significant disc protrusion identified on the soft tissue windows. Coronal reformatted images demonstrate an intact craniocervical junction, intact dens and intact lateral masses throughout. Note is made of a right apical pneumothorax. The patient had a CT chest, abdomen and pelvis which will be reported separately. IMPRESSION: 1. Normal intracranially. 2. No cervical spine fractures identified. 3. Right apical pneumothorax. Please see results of the CT chest, abdomen and pelvis reported separately. Electronically Signed   By: Hulan Saas M.D.   On: 02/26/2015 19:26   Ct Chest W Contrast  02/26/2015  CLINICAL DATA:  Diffuse back pain after being crushed between a forklift and metal back. The patient is unable to lift his legs. Right leg pain. EXAM:  CT CHEST, ABDOMEN, AND PELVIS WITH CONTRAST TECHNIQUE: Multidetector CT imaging of the chest, abdomen and pelvis was performed following the standard protocol during bolus administration of intravenous contrast. CONTRAST:  OMNIPAQUE IOHEXOL 300 MG/ML   SOLN COMPARISON:  None. FINDINGS: CT CHEST FINDINGS Mediastinum/Lymph Nodes: No mediastinal hemorrhage or enlarged lymph nodes. Lungs/Pleura: Approximately 20% right pneumothorax and minimal left pneumothorax. There is also mild patchy airspace opacity in the inferior aspect of the right lower lobe and a small amount of right pleural blood. No lung nodules. Musculoskeletal: Displaced, mildly comminuted left posterior eleventh rib fracture. This is at the level of the upper pole of the left kidney. Comminuted, displaced right posterior tenth rib fracture. This includes a fragment displaced anteriorly and protruding into the posterior aspect of the right lower lobe. There is also a mildly displaced right posterior eleventh rib fracture. Mildly displaced right posterior ninth rib fracture. No spinal fractures or subluxations are seen. CT ABDOMEN PELVIS FINDINGS Hepatobiliary: Small cyst in the central right lobe of the liver. The remainder of the liver appears normal. Pancreas: Normal. Spleen: Normal. Adrenals/Urinary Tract: Normal appearing adrenal glands. Mild, ill-defined low density in the upper pole of the right kidney with some skull loss of the corticomedullary differentiation. The renal arteries and veins appear intact bilaterally. Small right renal cyst. No ureteral or bladder abnormalities are seen. Normal-sized prostate gland. Stomach/Bowel: No abnormalities.  Normal appearing appendix. Vascular/Lymphatic: Unremarkable. Intact renal arteries and veins bilaterally. Reproductive: Unremarkable. Other: No free peritoneal fluid or air. 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 No other fractures and no subluxations. IMPRESSION: 1. Approximately 20% right pneumothorax and minimal left pneumothorax. 2. Contusion in the inferior, posterior aspect of the right lower lobe. 3. Small amount of right pleural blood. 4. Right posterior ninth, tenth and  eleventh rib fractures and left posterior eleventh rib fracture. The right posterior tenth rib fracture has an anteriorly displaced linear fragment with its tip within the posterior aspect of the right lower lobe. 5. Contusion of the upper pole of the left kidney. 6. 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 Critical Value/emergent results were called by telephone at the time of interpretation on 02/26/2015 at 7:31 pm to Dr. Elwin Mocha , who verbally acknowledged these results. Electronically Signed   By: Beckie Salts M.D.   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 The patient's body was pinned between a forklift and a metal rack. Patient unable to lift his legs. Initial encounter. EXAM: CT HEAD WITHOUT CONTRAST CT CERVICAL SPINE WITHOUT CONTRAST TECHNIQUE: Multidetector CT imaging of the head and cervical spine was performed following the standard protocol without intravenous contrast. Multiplanar CT image reconstructions of the cervical spine were also generated. COMPARISON:  None. FINDINGS: CT HEAD FINDINGS Ventricular system normal in size and appearance for age. No mass lesion. No midline shift. No acute hemorrhage or hematoma. No extra-axial fluid collections. No evidence of acute infarction. No focal brain parenchymal abnormalities. No skull fractures or other focal osseous abnormalities involving the skull. Small mucous retention cysts or polyps in the maxillary sinuses. Remaining paranasal sinuses, bilateral mastoid air cells, and bilateral middle ear cavities well-aerated. Note made of severe bony nasal septal deviation to the right. CT CERVICAL SPINE FINDINGS No fractures identified involving the cervical spine. Sagittal reconstructed images demonstrate anatomic posterior alignment.  No evidence of spinal stenosis. Neural foramina widely  patent throughout. Facet joints intact throughout. Disc spaces well preserved, and no significant disc protrusion identified on the soft tissue windows. Coronal reformatted images demonstrate an intact craniocervical junction, intact dens and intact lateral masses throughout. Note is made of a right apical pneumothorax. The patient had a CT chest, abdomen and pelvis which will be reported separately. IMPRESSION: 1. Normal intracranially. 2. No cervical spine fractures identified. 3. Right apical pneumothorax. Please see results of the CT chest, abdomen and pelvis reported separately. Electronically Signed   By: Hulan Saashomas  Lawrence M.D.   On: 02/26/2015 19:26   Ct Thoracic Spine Wo Contrast  02/26/2015  CLINICAL DATA:  Diffuse back pain following a crush injury between a forklift and metal rack. EXAM: CT THORACIC SPINE WITHOUT CONTRAST TECHNIQUE: Multidetector CT imaging of the thoracic spine was performed without intravenous contrast administration. Multiplanar CT image reconstructions were also generated. COMPARISON:  Chest CT obtained at the same time. FINDINGS: Previously described bilateral pneumothoraces and bilateral rib fractures. Previously described right lower lobe pulmonary contusion and small amount of right pleural blood. No thoracic spine fracture or subluxation is seen. Small Schmorl's nodes at multiple levels. IMPRESSION: 1. No thoracic spine fracture or subluxation. 2. Previously described bilateral rib fractures, right lower lobe pulmonary contusion, right pleural blood and bilateral pneumothoraces. Electronically Signed   By: Beckie SaltsSteven  Reid M.D.   On: 02/26/2015 19:49   Ct Lumbar Spine Wo Contrast  02/26/2015  CLINICAL DATA:  Initial encounter for fork lift accident earlier today. Pain down entire spine. EXAM: CT LUMBAR SPINE WITHOUT CONTRAST TECHNIQUE: Multidetector CT imaging of the lumbar spine was performed without intravenous contrast administration. Multiplanar CT image reconstructions  were also generated. COMPARISON:  None. FINDINGS: No evidence for lumbar spine fracture. Intervertebral disc spaces are preserved. The facets are well aligned bilaterally. Bilateral L5 pars interarticularis defects are evident with trace anterolisthesis of L5 on S1. No evidence for paraspinal hematoma. Fracture the posterior right eleventh and twelfth ribs noted. Although incompletely characterize, there appears to be decreased perfusion to the upper pole of the left kidney. IMPRESSION: 1. No evidence for lumbar spine fracture. 2. 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 3. Acute fractures of the posterior right eleventh and twelfth ribs. Of note, the entire ribs have not been fully visualized on either side on this dedicated lumbar spine CT. 4. Apparent decreased or altered perfusion to the upper pole the left kidney, incompletely characterized. Electronically Signed   By: Kennith CenterEric  Mansell M.D.   On: 02/26/2015 19:58   Ct Abdomen Pelvis W Contrast  02/26/2015  CLINICAL DATA:  Diffuse back pain after being crushed between a forklift and metal back. The patient is unable to lift his legs. Right leg pain. EXAM: CT CHEST, ABDOMEN, AND PELVIS WITH CONTRAST TECHNIQUE: Multidetector CT imaging of the chest, abdomen and pelvis was performed following the standard protocol during bolus administration of intravenous contrast. CONTRAST:  100mL OMNIPAQUE IOHEXOL 300 MG/ML  SOLN COMPARISON:  None. FINDINGS: CT CHEST FINDINGS Mediastinum/Lymph Nodes: No mediastinal hemorrhage or enlarged lymph nodes. Lungs/Pleura: Approximately 20% right pneumothorax and minimal left pneumothorax. There is also mild patchy airspace opacity in the inferior aspect of the right lower lobe and a small amount of right pleural blood. No lung nodules. Musculoskeletal: Displaced, mildly comminuted left posterior eleventh rib fracture. This is at the level of the upper pole of the left kidney. Comminuted, displaced right  posterior tenth rib  fracture. This includes a fragment displaced anteriorly and protruding into the posterior aspect of the right lower lobe. There is also a mildly displaced right posterior eleventh rib fracture. Mildly displaced right posterior ninth rib fracture. No spinal fractures or subluxations are seen. CT ABDOMEN PELVIS FINDINGS Hepatobiliary: Small cyst in the central right lobe of the liver. The remainder of the liver appears normal. Pancreas: Normal. Spleen: Normal. Adrenals/Urinary Tract: Normal appearing adrenal glands. Mild, ill-defined low density in the upper pole of the right kidney with some skull loss of the corticomedullary differentiation. The renal arteries and veins appear intact bilaterally. Small right renal cyst. No ureteral or bladder abnormalities are seen. Normal-sized prostate gland. Stomach/Bowel: No abnormalities.  Normal appearing appendix. Vascular/Lymphatic: Unremarkable. Intact renal arteries and veins bilaterally. Reproductive: Unremarkable. Other: No free peritoneal fluid or air. 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 No other fractures and no subluxations. IMPRESSION: 1. Approximately 20% right pneumothorax and minimal left pneumothorax. 2. Contusion in the inferior, posterior aspect of the right lower lobe. 3. Small amount of right pleural blood. 4. Right posterior ninth, tenth and eleventh rib fractures and left posterior eleventh rib fracture. The right posterior tenth rib fracture has an anteriorly displaced linear fragment with its tip within the posterior aspect of the right lower lobe. 5. Contusion of the upper pole of the left kidney. 6. 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 Critical Value/emergent results were called by telephone at the time of interpretation on 02/26/2015 at 7:31 pm to Dr. Elwin Mocha , who verbally acknowledged these  results. Electronically Signed   By: Beckie Salts M.D.   On: 02/26/2015 19:45   Dg Pelvis Portable  02/26/2015  CLINICAL DATA:  Right thigh pain after being pinned between a forklift and metal rack today. EXAM: PORTABLE PELVIS 1-2 VIEWS COMPARISON:  None. FINDINGS: The right iliac crest is not included in its entirety. The remainder of the pelvis has a normal appearance without fracture or dislocation. IMPRESSION: No fracture or dislocation. Electronically Signed   By: Beckie Salts M.D.   On: 02/26/2015 18:36   Dg Chest Port 1 View  02/27/2015  CLINICAL DATA:  Follow-up rib fractures EXAM: PORTABLE CHEST 1 VIEW COMPARISON:  Portable chest x-ray of February 26, 2015 FINDINGS: The lungs are well-expanded. There is no pneumothorax or pleural effusion. The right-sided chest tube is stable. The heart and pulmonary vascularity are normal. The mediastinum is normal in width. The observed bony thorax exhibits no acute abnormality. The known right rib fractures are not well demonstrated. IMPRESSION: There is no active cardiopulmonary disease. No recurrent pneumothorax is observed. Electronically Signed   By: David  [place] M.D.   On: 02/27/2015 07:35   Dg Chest Port 1 View  02/26/2015  CLINICAL DATA:  Status post chest tube placement EXAM: PORTABLE CHEST - 1 VIEW COMPARISON:  02/26/2015 FINDINGS: Right-sided chest tube is been placed in lies in the right apex. The previously seen right-sided pneumothorax has resolved in the interval. Left lung remains clear. The cardiac shadow is stable. The known right-sided rib fractures are not well appreciated on this study. IMPRESSION: Resolution of right-sided pneumothorax following chest tube placement. Electronically Signed   By: Alcide Clever M.D.   On: 02/26/2015 22:52   Dg Chest Portable 1 View  02/26/2015  CLINICAL DATA:  Trauma from fork lift.  Initial encounter. EXAM: PORTABLE CHEST 1 VIEW COMPARISON:  None. FINDINGS: The heart size and  mediastinal contours are  within normal limits. There is no evidence of pulmonary edema, consolidation, pneumothorax, nodule or pleural fluid. The visualized skeletal structures are unremarkable. IMPRESSION: No acute findings. Electronically Signed   By: Irish Lack M.D.   On: 02/26/2015 18:34   Dg Femur Port, 1v Right  02/26/2015  CLINICAL DATA:  Right thigh pain after being pinned between a forklift and metal rack today. EXAM: RIGHT FEMUR PORTABLE 1 VIEW COMPARISON:  None. FINDINGS: 2 portable AP views of the right femur demonstrate normal appearing bones and soft tissues. No fracture or dislocation seen on these views. IMPRESSION: Limited examination in the AP projection only with no visible fracture or dislocation. If there remains a clinical concern for fracture, a routine two-view examination would be recommended. Electronically Signed   By: Beckie Salts M.D.   On: 02/26/2015 18:35    Anti-infectives: Anti-infectives    None      Assessment/Plan: s/p  Continue foley due to acute urinary retention Will add some toradol for pain control  Persistent nausea has been a problem with pain medications Acute urinary retention, will start Flomax and urecholine.  LOS: 1 day   Marta Lamas. Gae Bon, MD, FACS [phone] Trauma Surgeon 02/27/2015

## 2015-02-27 NOTE — Progress Notes (Signed)
Inpatient Rehabilitation  Have received prescreen request for possible IP Rehab admission.  I note that pt. Currently is at a min guard assist level with PT tasks though limited in distance and tolerance.  I anticipate pt. will progress adequately with acute therapies and will not require IP Rehab.  At this time, we are not recommending IP Rehab consult.  Please call if questions.   Weldon PickingSusan Gavan Nordby PT Inpatient Rehab Admissions Coordinator Cell [phone] Office [phone]

## 2015-02-27 NOTE — Progress Notes (Signed)
Throughout night pt had repeated difficulty with nausea and subsequent emesis only partially relieved by IV anti-emetics (Zofran ineffective, Compazine and Phenergan provided partial relief).  Pt also endorsed severe claustrophobia, unrelieved pain, and anxiety surrounding his hospitalization. Twice (@approx . 0200 and 0600) pt appeared to be having panic attacks and after the second episode, Ativan 1mg  IV, via telephone order from Dr. Janee Mornhompson, was administered and effective at relaxing pt.  Pt refused analgesia throughout the night citing nausea and subsequent emesis as more painful than his persistent R flank pain.  Pt also developed acute urinary retention as evidence by a bladder scan and per telephone order from Dr. Janee Mornhompson, a foley was inserted via sterile technique and immediately delivered 900ml of clear, yellow urine.  Currently, pt appears to be resting comfortably and in no acute distress with mother at bedside. The above information was conveyed in report to oncoming RN, Robyn.

## 2015-02-27 NOTE — Progress Notes (Signed)
Received written telephone message from [employer], workers comp, for pt stating they need a 9-panel drug test. Spoke with Raynelle FanningJulie, Sports coachcase manager, who stated that I needed to call Casimiro NeedleMichael, [place] for trauma to request order for test.  Jamesetta Orleansalled Michael and he stated we could not collect it but company could send someone to the hospital to collect it from the patient and have their own test.  I called Olegario MessierKathy and reported this to her. She stated it was just a urine test and asked that we try to get it for them.  I will call Raynelle FanningJulie, case manager back and report this and also charge nurse Deanna. Marisue Ivanobyn Ebin Palazzi RN

## 2015-02-28 ENCOUNTER — Inpatient Hospital Stay (HOSPITAL_COMMUNITY): Payer: Worker's Compensation

## 2015-02-28 LAB — CBC WITH DIFFERENTIAL/PLATELET
BASOS ABS: 0 10*3/uL (ref 0.0–0.1)
Basophils Relative: 0 %
EOS ABS: 0.1 10*3/uL (ref 0.0–0.7)
EOS PCT: 2 %
HCT: 34 % — ABNORMAL LOW (ref 39.0–52.0)
HEMOGLOBIN: 11.5 g/dL — AB (ref 13.0–17.0)
LYMPHS PCT: 26 %
Lymphs Abs: 1.6 10*3/uL (ref 0.7–4.0)
MCH: 31.3 pg (ref 26.0–34.0)
MCHC: 33.8 g/dL (ref 30.0–36.0)
MCV: 92.4 fL (ref 78.0–100.0)
Monocytes Absolute: 0.5 10*3/uL (ref 0.1–1.0)
Monocytes Relative: 8 %
Neutro Abs: 4 10*3/uL (ref 1.7–7.7)
Neutrophils Relative %: 64 %
PLATELETS: 133 10*3/uL — AB (ref 150–400)
RBC: 3.68 MIL/uL — AB (ref 4.22–5.81)
RDW: 13.2 % (ref 11.5–15.5)
WBC: 6.2 10*3/uL (ref 4.0–10.5)

## 2015-02-28 MED ORDER — TRAMADOL HCL 50 MG PO TABS
50.0000 mg | ORAL_TABLET | Freq: Four times a day (QID) | ORAL | Status: DC
  Administered 2015-02-28 – 2015-03-01 (×4): 50 mg via ORAL
  Filled 2015-02-28 (×4): qty 1
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
## 2015-02-28 NOTE — Progress Notes (Signed)
Patient ID: Erik AuerbachJustin Kirk, male   DOB: [birth year], 24 y.o.   MRN: [id number]    Subjective: Up in chair, hungry, working on IS  Objective: Vital signs in last 24 hours: Temp:  [97.4 F (36.3 C)-98.4 F (36.9 C)] 97.9 F (36.6 C) (11/02 0811) Pulse Rate:  [73-94] 81 (11/02 0315) Resp:  [16-23] 23 (11/02 0315) BP: (113-131)/(58-64) 117/62 mmHg (11/02 0315) SpO2:  [98 %-100 %] 100 % (11/02 0315)    Intake/Output from previous day: 11/01 0701 - 11/02 0700 In: 3600 [P.O.:1800; I.V.:1800] Out: 2730 [Urine:2700; Chest Tube:30] Intake/Output this shift:    General appearance: alert and cooperative Resp: clear to auscultation bilaterally Chest wall: right sided chest wall tenderness, left sided chest wall tenderness Cardio: regular rate and rhythm GI: soft, NT, +BS Extremities: calves soft Neurologic: Mental status: Alert, oriented, thought content appropriate  Lab Results: CBC   Recent Labs  02/27/15 0333 02/28/15 0338  WBC 10.7* 6.2  HGB 12.5* 11.5*  HCT 37.4* 34.0*  PLT 171 133*   BMET  Recent Labs  02/26/15 1820 02/27/15 0333  NA 134* 134*  K 3.6 4.2  CL 101 102  CO2 22 26  GLUCOSE 131* 152*  BUN 13 12  CREATININE 1.30* 1.29*  CALCIUM 9.2 8.4*   Anti-infectives: Anti-infectives    None      Assessment/Plan: Pinned by forklift R rib FX 9-11 with PTX, L 11th rib FX with occult PTX - water seal CT, doing 1000cc on IS, continue pulm toilet L grade 1 renal contusion - urine clear Acute urinary retention - D/C foley, on Flomax and urecholine FEN - adv to reg diet, oral NSAIDs when toradol times out Dispo - to floor I spoke with his mother   LOS: 2 days    Violeta GelinasBurke Santhosh Gulino, MD, MPH, FACS Trauma: [phone] General Surgery: [phone]  02/28/2015

## 2015-02-28 NOTE — Progress Notes (Signed)
Called report to Jabil CircuitJoan RN. Family at bedside and aware of pt transfer to 6N02.
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
## 2015-03-01 ENCOUNTER — Inpatient Hospital Stay (HOSPITAL_COMMUNITY): Payer: Worker's Compensation

## 2015-03-01 ENCOUNTER — Encounter: Payer: Self-pay | Admitting: Orthopedic Surgery

## 2015-03-01 DIAGNOSIS — R338 Other retention of urine: Secondary | ICD-10-CM | POA: Diagnosis not present

## 2015-03-01 DIAGNOSIS — S37002A Unspecified injury of left kidney, initial encounter: Secondary | ICD-10-CM | POA: Diagnosis present

## 2015-03-01 DIAGNOSIS — S298XXA Other specified injuries of thorax, initial encounter: Secondary | ICD-10-CM | POA: Diagnosis present

## 2015-03-01 LAB — CBC
HCT: 34.8 % — ABNORMAL LOW (ref 39.0–52.0)
HEMOGLOBIN: 11.7 g/dL — AB (ref 13.0–17.0)
MCH: 31 pg (ref 26.0–34.0)
MCHC: 33.6 g/dL (ref 30.0–36.0)
MCV: 92.1 fL (ref 78.0–100.0)
PLATELETS: 143 10*3/uL — AB (ref 150–400)
RBC: 3.78 MIL/uL — ABNORMAL LOW (ref 4.22–5.81)
RDW: 12.9 % (ref 11.5–15.5)
WBC: 7.8 10*3/uL (ref 4.0–10.5)

## 2015-03-01 MED ORDER — NAPROXEN 500 MG PO TABS: 500.0000 mg | ORAL_TABLET | Freq: Two times a day (BID) | ORAL | Status: AC

## 2015-03-01 MED ORDER — TRAMADOL HCL 50 MG PO TABS: 50.0000 mg | ORAL_TABLET | Freq: Four times a day (QID) | ORAL | Status: AC | PRN

## 2015-03-01 MED ORDER — NAPROXEN 250 MG PO TABS: 500.0000 mg | ORAL_TABLET | Freq: Two times a day (BID) | ORAL | Status: DC

## 2015-03-01 MED ORDER — TRAMADOL HCL 50 MG PO TABS
50.0000 mg | ORAL_TABLET | Freq: Four times a day (QID) | ORAL | Status: DC | PRN
  Administered 2015-03-01: 100 mg via ORAL
  Filled 2015-03-01: qty 2
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
## 2015-03-01 NOTE — Discharge Planning (Signed)
AVS to pt who verbalizes understanding, given rx also.  Has tolerated liquids without further nausea, vomiting.  D'cd amb to private car home with all personal belongings at 1700.

## 2015-03-01 NOTE — Progress Notes (Signed)
Spoke with Biagio BorgStephanie Scott, Case Manager for [employer].  Phone # [phone], ext [phone]. Informed her of pt's discharge home today, and no needs identified, per PT/OT.  Out of work transition letter and discharge summary faxed to her at [phone].    Quintella BatonJulie W. Syble Picco, RN, BSN  Trauma/Neuro ICU Case Manager [phone]

## 2015-03-01 NOTE — Procedures (Signed)
CHEST TUBE INSERTION Date/Time: 02/26/2015 10:12 PM Performed by: Jonette EvaHAPMAN, Brylee Berk Authorized by: Violeta GelinasHOMPSON, BURKE Consent: The procedure was performed in an emergent situation. Verbal consent obtained. Risks and benefits: risks, benefits and alternatives were discussed Consent given by: patient Test results: test results available and properly labeled Site marked: the operative site was marked Imaging studies: imaging studies available Required items: required blood products, implants, devices, and special equipment available Patient identity confirmed: verbally with patient and hospital-assigned identification number Time out: Immediately prior to procedure a "time out" was called to verify the correct patient, procedure, equipment, support staff and site/side marked as required. Indications: pneumothorax, hemothorax and hemopneumothorax Patient sedated: no Anesthesia: local infiltration Local anesthetic: lidocaine 1% with epinephrine Anesthetic total: 20 ml Preparation: skin prepped with alcohol, skin prepped with Betadine and sterile field established Placement location: right anterior Scalpel size: 11 Tube size: 20 [place] Dissection instrument: finger and Kelly clamp Ultrasound guidance: no Tension pneumothorax heard: no Tube connected to: suction Drainage characteristics: bloody Drainage amount: 10 ml Suture material: 0 silk Dressing: 4x4 sterile gauze, petrolatum-impregnated gauze and Xeroform gauze Post-insertion x-ray findings: tube in good position Patient tolerance: Patient tolerated the procedure well with no immediate complications.

## 2015-03-01 NOTE — Discharge Instructions (Signed)
Leave dressing in place until Saturday, then Wash wound daily in shower with soap and water. Do not soak. Apply antibiotic ointment (e.g. Neosporin) twice daily and as needed to keep moist. Cover with dry dressing.  No running, jumping, ball or contact sports, bikes, skateboards, motorcycles, etc for 6 weeks.
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
## 2015-03-01 NOTE — Discharge Summary (Signed)
Physician Discharge Summary  Patient ID: Erik Kirk MRN: [id number] DOB/AGE: 09-15-1990 24 y.o.  Admit date: 02/26/2015 Discharge date: 03/01/2015  Discharge Diagnoses Patient Active Problem List   Diagnosis Date Noted  . Blunt chest trauma 03/01/2015  . Left kidney injury 03/01/2015  . Acute urinary retention 03/01/2015  . Multiple rib fractures 02/26/2015    Consultants None   Procedures 10/31 -- Right tube thoracostomy by Dr. Jonette EvaBrad Chapman   HPI: Erik Kirk was working around a forklift when he was pinned up against a metal structure. There was no loss of consciousness. He was unsure how long he was pinned. He came in as a level 2 trauma. Work-up demonstrated bilateral rib fractures with a right pneumothorax. He also had a grade 1 left renal contusion. A chest tube was placed by the ED resident under the trauma surgeon's supervision and he was admitted to the trauma service.   Hospital Course: The patient chest tube was able to be weaned to water seal and removed without difficulty. His pain was controlled on oral medications. He developed some urinary retention and had a foley catheter placed. After a couple of days of urecholine and Flomax a voiding trial was successful. On the day of discharge he did vomit but this was thought to be due to his taking his pain medication on an empty stomach.  He plans to wait for a few hours and as long as he can keep liquids down and is feeling good he will discharge home in good condition.     Medication List    TAKE these medications        naproxen 500 MG tablet  Commonly known as:  NAPROSYN  Take 1 tablet (500 mg total) by mouth 2 (two) times daily with a meal.     traMADol 50 MG tablet  Commonly known as:  ULTRAM  Take 1-2 tablets (50-100 mg total) by mouth every 6 (six) hours as needed (Pain).            Follow-up Information    Follow up with [facility] On 04/11/2015.   Why:  2:00PM   Contact information:   [address] [place] [phone]       Signed: Freeman CaldronMichael J. Ramaya Guile, PA-C Pager: [phone] [facility] Tra[facility] [place] Pager: [phone] 03/01/2015, 2:56 PM

## 2017-10-22 IMAGING — CT CT CHEST W/ CM
2 of 5 series · 12 of 36 positions shown, 15 images · IV contrast (Omni 300)
Comparison: None.

CLINICAL DATA: Diffuse back pain after being crushed between a
forklift and metal back. The patient is unable to lift his legs.
Right leg pain.

EXAM:
CT CHEST, ABDOMEN, AND PELVIS WITH CONTRAST
TECHNIQUE: Multidetector CT imaging of the chest, abdomen and pelvis was
performed following the standard protocol during bolus
administration of intravenous contrast.
CONTRAST:  100mL OMNIPAQUE IOHEXOL 300 MG/ML  SOLN

[Series 3: cap with · axial · 0.77mm/px · z∈[-1243,-638]mm · 9 of 139 slices shown, 12 images]
[im 9/139  mediastinal]
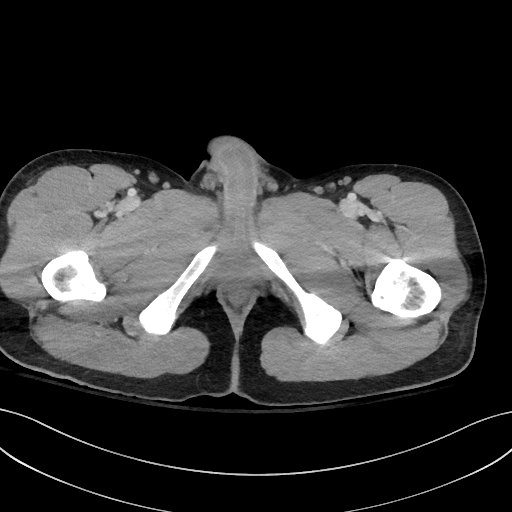
[im 9/139  lung]
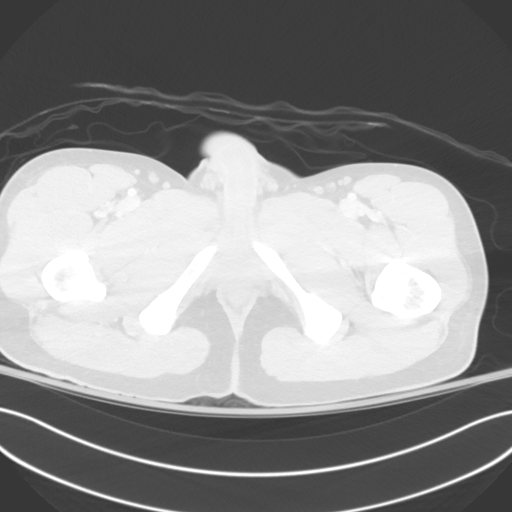
[im 26/139  lung]
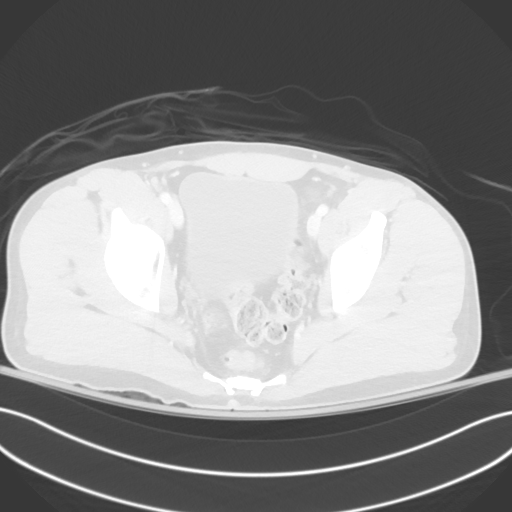
[im 44/139  lung]
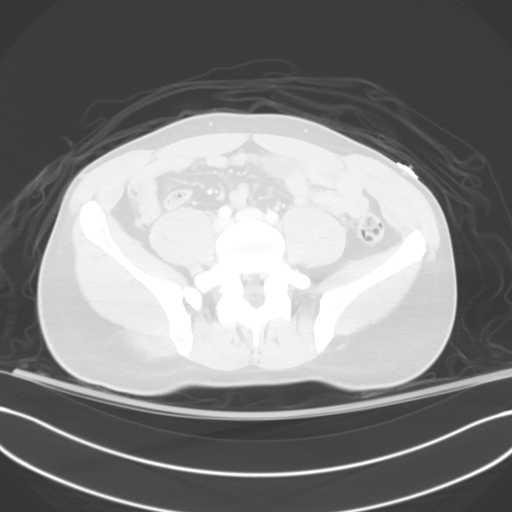
[im 52/139  lung]
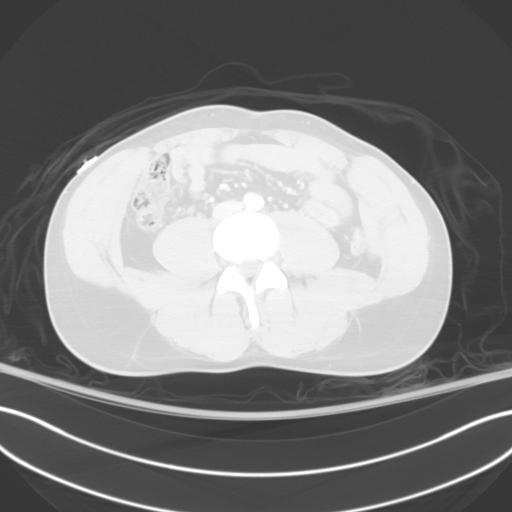
[im 70/139  mediastinal]
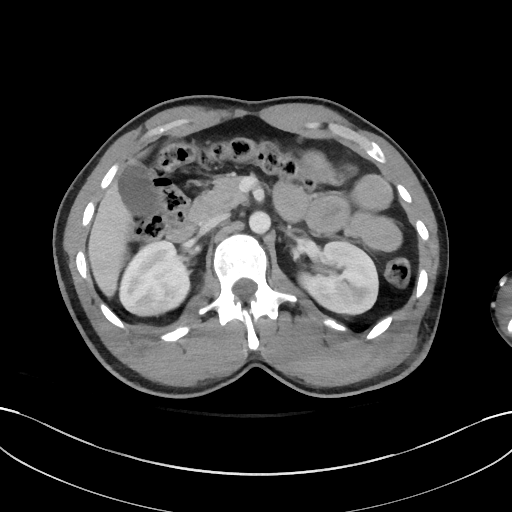
[im 70/139  lung]
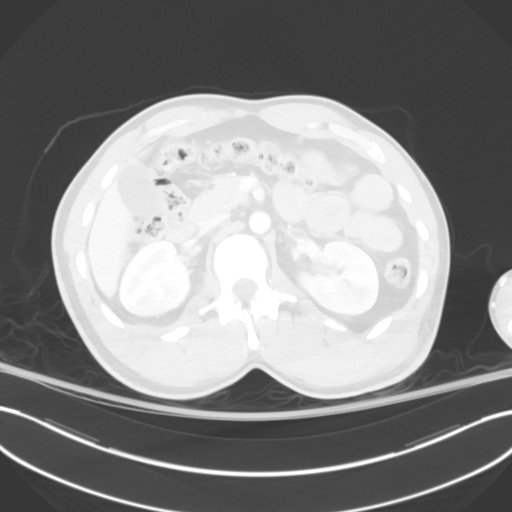
[im 87/139  lung]
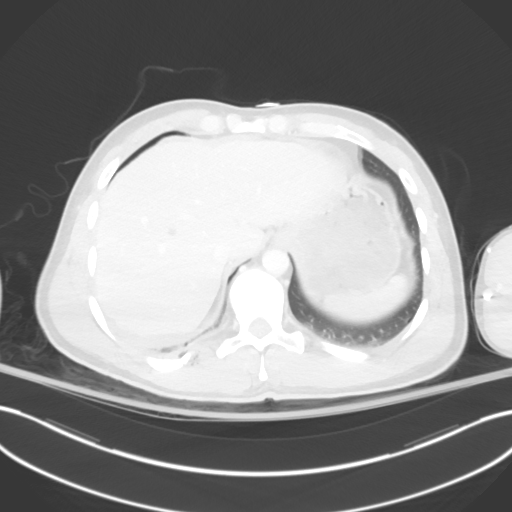
[im 95/139  lung]
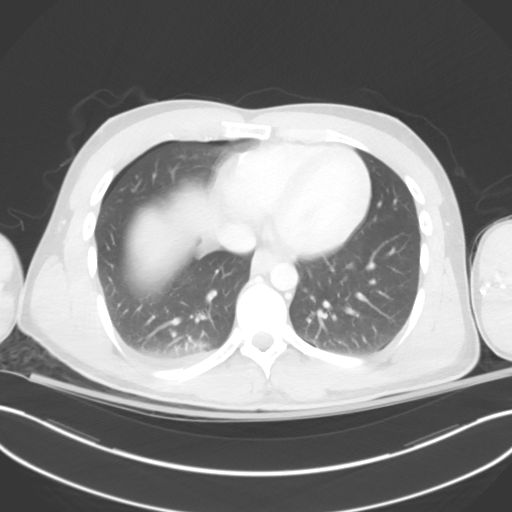
[im 113/139  lung]
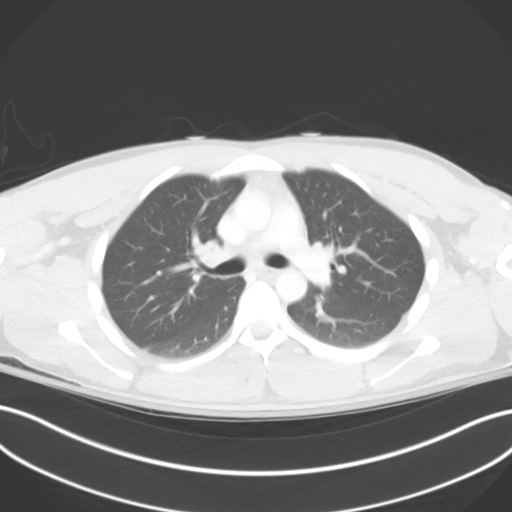
[im 130/139  mediastinal]
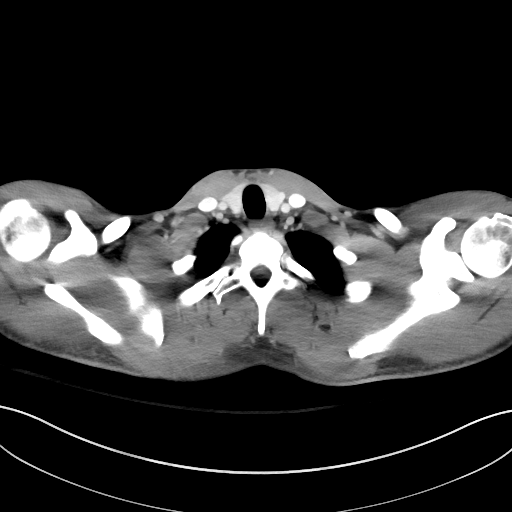
[im 130/139  lung]
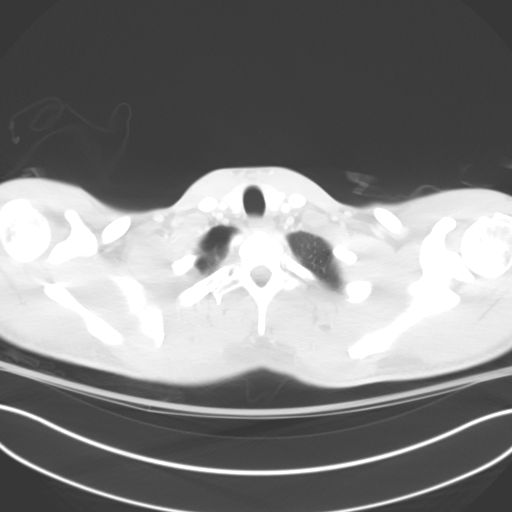

[Series 5: cap with 3.0 mm st cor · coronal · 0.68mm/px · 3 of 78 slices shown]
[im 16/78  lung]
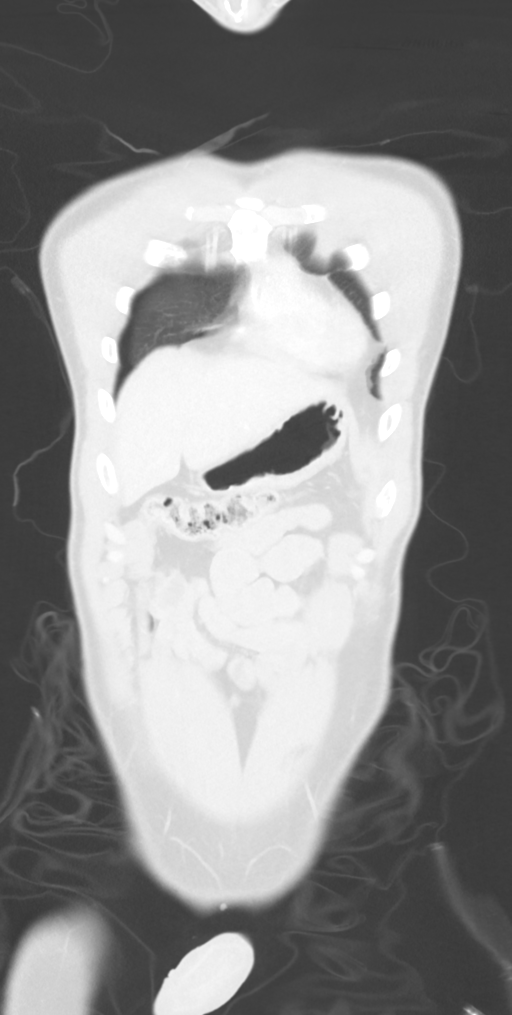
[im 31/78  lung]
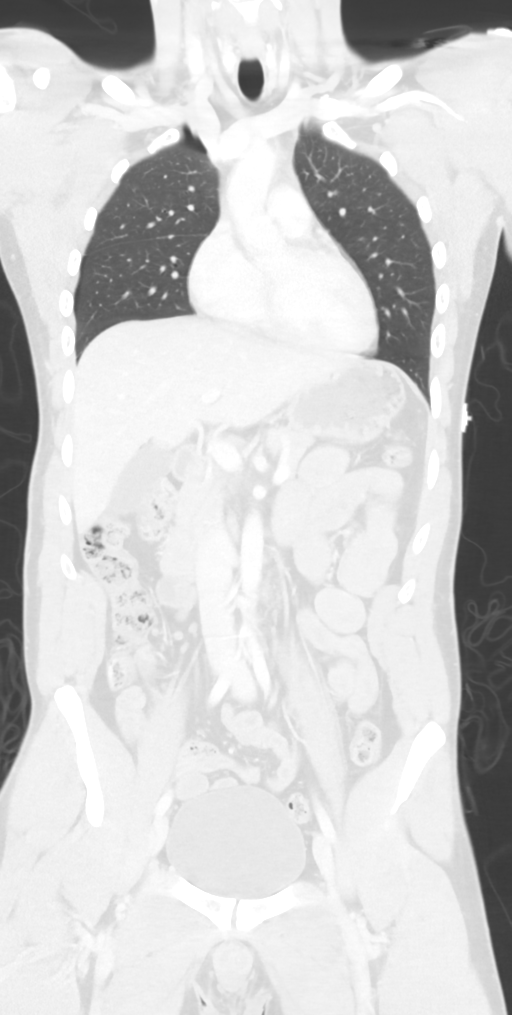
[im 47/78  lung]
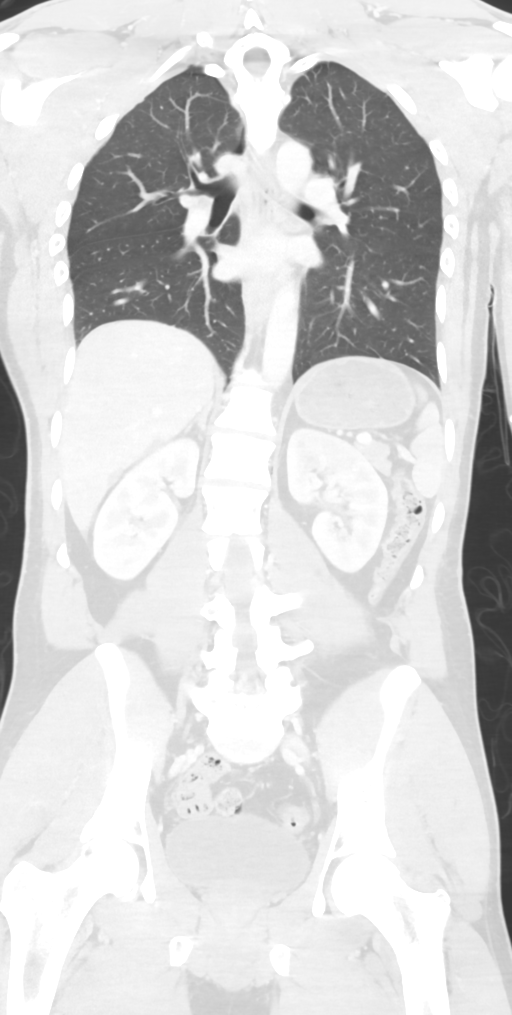

[12 of 36 positions shown; findings below may reference images not displayed]

FINDINGS: CT CHEST FINDINGS

Mediastinum/Lymph Nodes: No mediastinal hemorrhage or enlarged lymph
nodes.

Lungs/Pleura: Approximately 20% right pneumothorax and minimal left
pneumothorax. There is also mild patchy airspace opacity in the
inferior aspect of the right lower lobe and a small amount of right
pleural blood. No lung nodules.

Musculoskeletal: Displaced, mildly comminuted left posterior
eleventh rib fracture. This is at the level of the upper pole of the
left kidney. Comminuted, displaced right posterior tenth rib
fracture. This includes a fragment displaced anteriorly and
protruding into the posterior aspect of the right lower lobe. There
is also a mildly displaced right posterior eleventh rib fracture.
Mildly displaced right posterior ninth rib fracture. No spinal
fractures or subluxations are seen.

CT ABDOMEN PELVIS FINDINGS

Hepatobiliary: Small cyst in the central right lobe of the liver.
The remainder of the liver appears normal.

Pancreas: Normal.

Spleen: Normal.

Adrenals/Urinary Tract: Normal appearing adrenal glands. Mild,
ill-defined low density in the upper pole of the right kidney with
some skull loss of the corticomedullary differentiation. The renal
arteries and veins appear intact bilaterally. Small right renal
cyst. No ureteral or bladder abnormalities are seen. Normal-sized
prostate gland.

Stomach/Bowel: No abnormalities.  Normal appearing appendix.

Vascular/Lymphatic: Unremarkable. Intact renal arteries and veins
bilaterally.

Reproductive: Unremarkable.

Other: No free peritoneal fluid or air.

Musculoskeletal: Bilateral L5 pars interarticularis defects with
minimal anterolisthesis at the L5-S1 level. No other fractures and
no subluxations.
IMPRESSION: 1. Approximately 20% right pneumothorax and minimal left
pneumothorax.
2. Contusion in the inferior, posterior aspect of the right lower
lobe.
3. Small amount of right pleural blood.
4. Right posterior ninth, tenth and eleventh rib fractures and left
posterior eleventh rib fracture. The right posterior tenth rib
fracture has an anteriorly displaced linear fragment with its tip
within the posterior aspect of the right lower lobe.
5. Contusion of the upper pole of the left kidney.
6. Bilateral L5 spondylolysis with associated minimal grade 1
spondylolisthesis at the L5-S1 level.

Critical Value/emergent results were called by telephone at the time
of interpretation on 02/26/2015 at [DATE] to Dr. KENNY ROUSSEAU , who
verbally acknowledged these results.

## 2017-10-25 IMAGING — CR DG CHEST 1V PORT
1 series · 1 of 1 positions shown · non-contrast
Comparison: Portable chest x-ray February 28, 2015

CLINICAL DATA: Follow-up but right-sided pneumothorax

EXAM:
PORTABLE CHEST 1 VIEW

[AP]
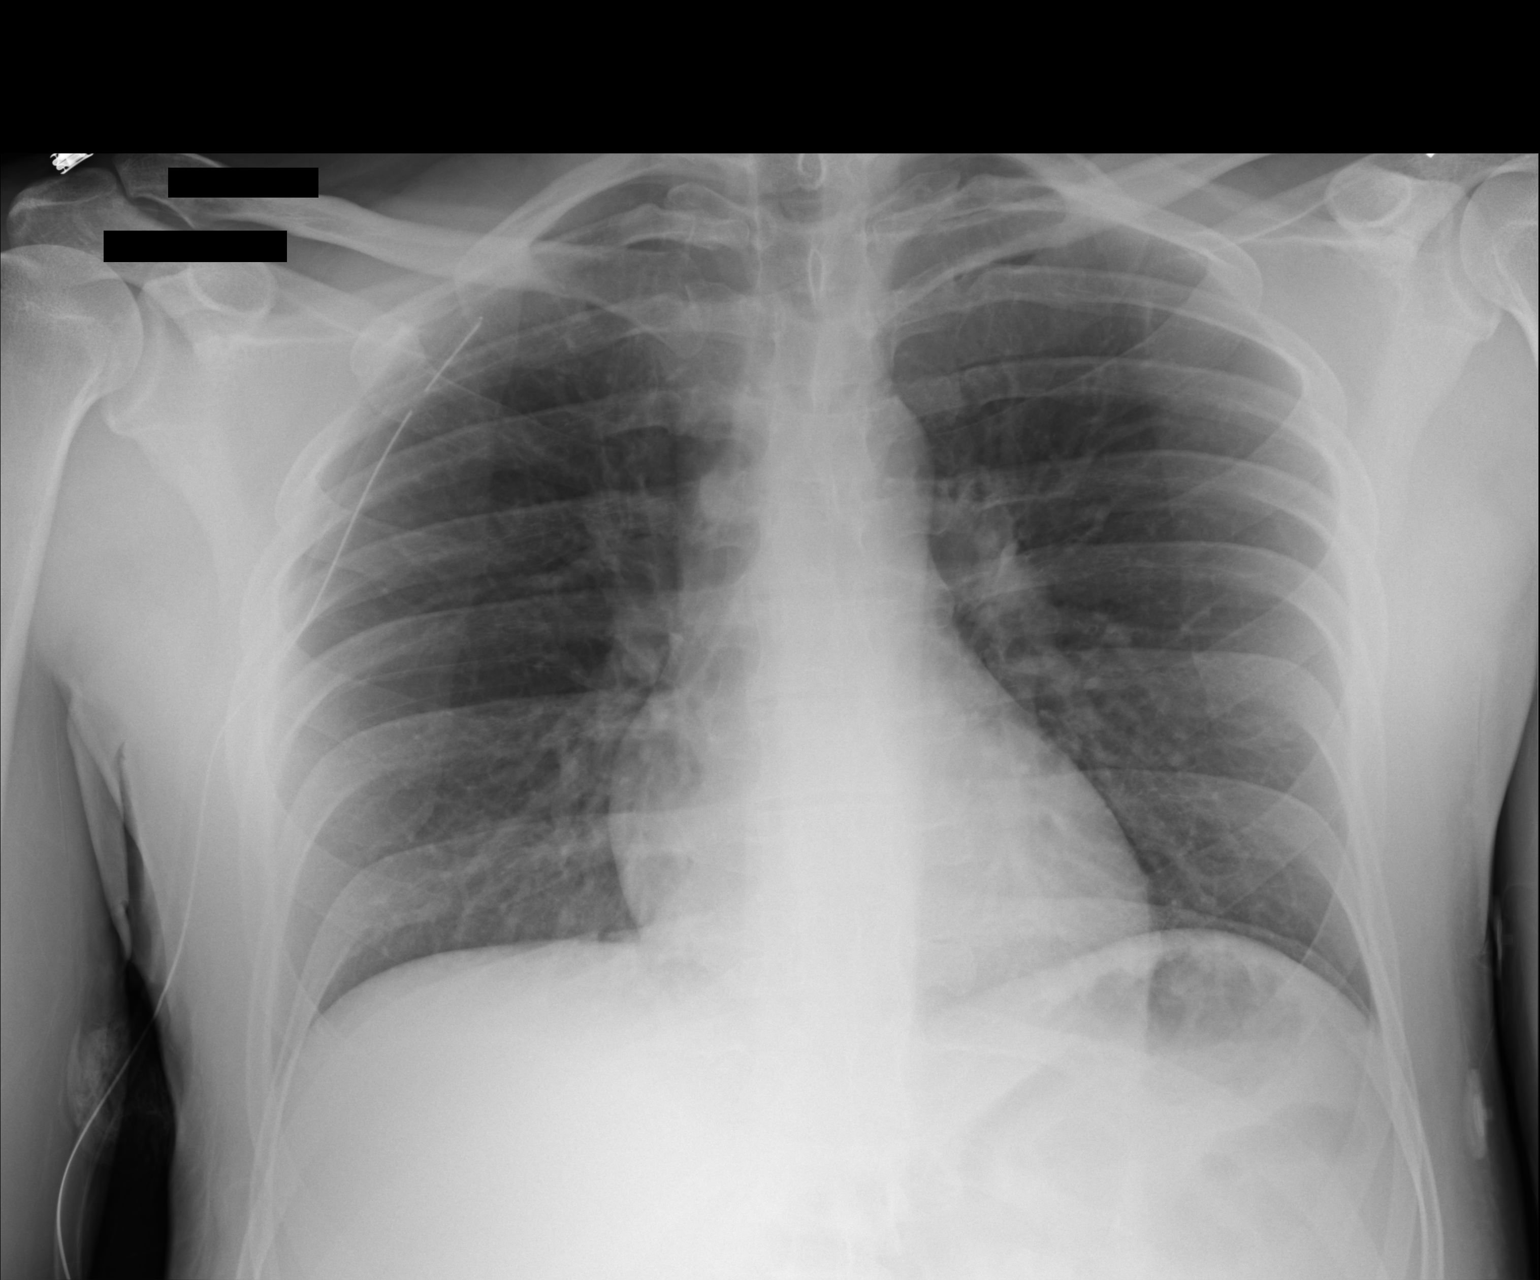

[1 of 1 positions shown; findings below may reference images not displayed]

FINDINGS: The lungs are well-expanded. No residual right-sided pneumothorax is
evident. The right-sided chest tube tip projects over the posterior
second and third rib interspace. The left lung is clear. There is no
pleural effusion. The heart and mediastinal structures are normal.
The bony thorax exhibits no acute abnormality.
IMPRESSION: No residual right-sided pneumothorax is observed. There is no
pleural effusion either. The chest tube is in reasonable position.
